# Patient Record
Sex: Female | Born: 1960 | Race: Black or African American | Hispanic: No | Marital: Single
Health system: Southern US, Community
[De-identification: ages and names within clinical notes are randomized; demographics above are authoritative.]

---

## 2021-01-08 ENCOUNTER — Emergency Department (HOSPITAL_COMMUNITY)
Admission: EM | Admit: 2021-01-08 | Discharge: 2021-01-09 | Disposition: A | Discharge status: Home / Self Care | Payer: Medicaid Other | Attending: Emergency Medicine | Admitting: Emergency Medicine

## 2021-01-08 ENCOUNTER — Emergency Department (HOSPITAL_COMMUNITY): Payer: Medicaid Other

## 2021-01-08 DIAGNOSIS — Z20822 Contact with and (suspected) exposure to covid-19: Secondary | ICD-10-CM | POA: Diagnosis not present

## 2021-01-08 DIAGNOSIS — R8271 Bacteriuria: Secondary | ICD-10-CM

## 2021-01-08 DIAGNOSIS — I10 Essential (primary) hypertension: Secondary | ICD-10-CM | POA: Insufficient documentation

## 2021-01-08 DIAGNOSIS — R519 Headache, unspecified: Secondary | ICD-10-CM

## 2021-01-08 DIAGNOSIS — R202 Paresthesia of skin: Secondary | ICD-10-CM | POA: Insufficient documentation

## 2021-01-08 DIAGNOSIS — R2990 Unspecified symptoms and signs involving the nervous system: Secondary | ICD-10-CM | POA: Diagnosis not present

## 2021-01-08 DIAGNOSIS — Z859 Personal history of malignant neoplasm, unspecified: Secondary | ICD-10-CM | POA: Diagnosis not present

## 2021-01-08 DIAGNOSIS — Z79899 Other long term (current) drug therapy: Secondary | ICD-10-CM | POA: Diagnosis not present

## 2021-01-08 DIAGNOSIS — R42 Dizziness and giddiness: Secondary | ICD-10-CM | POA: Diagnosis not present

## 2021-01-08 DIAGNOSIS — M545 Low back pain, unspecified: Secondary | ICD-10-CM

## 2021-01-08 DIAGNOSIS — M549 Dorsalgia, unspecified: Secondary | ICD-10-CM | POA: Insufficient documentation

## 2021-01-08 DIAGNOSIS — R791 Abnormal coagulation profile: Secondary | ICD-10-CM | POA: Diagnosis not present

## 2021-01-08 LAB — I-STAT BETA HCG BLOOD, ED (MC, WL, AP ONLY): I-stat hCG, quantitative: <5 m[IU]/mL (ref <5)

## 2021-01-08 LAB — COMPREHENSIVE METABOLIC PANEL
ALT: 17 U/L (ref 0–44)
AST: 19 U/L (ref 15–41)
Albumin: 3.9 g/dL (ref 3.5–5.0)
Alkaline Phosphatase: 100 U/L (ref 38–126)
Anion gap: 10 (ref 5–15)
BUN: 14 mg/dL (ref 6–20)
CO2: 26 mmol/L (ref 22–32)
Calcium: 9.1 mg/dL (ref 8.9–10.3)
Chloride: 100 mmol/L (ref 98–111)
Creatinine, Ser: 0.95 mg/dL (ref 0.44–1.00)
GFR, Estimated: >60 mL/min (ref >60)
Glucose, Bld: 112 mg/dL — ABNORMAL HIGH (ref 70–99)
Potassium: 3.8 mmol/L (ref 3.5–5.1)
Sodium: 136 mmol/L (ref 135–145)
Total Bilirubin: 0.6 mg/dL (ref 0.3–1.2)
Total Protein: 7.4 g/dL (ref 6.5–8.1)

## 2021-01-08 LAB — I-STAT CHEM 8, ED
BUN: 16 mg/dL (ref 6–20)
Calcium, Ion: 1.06 mmol/L — ABNORMAL LOW (ref 1.15–1.40)
Chloride: 103 mmol/L (ref 98–111)
Creatinine, Ser: 0.9 mg/dL (ref 0.44–1.00)
Glucose, Bld: 109 mg/dL — ABNORMAL HIGH (ref 70–99)
HCT: 51 % — ABNORMAL HIGH (ref 36.0–46.0)
Hemoglobin: 17.3 g/dL — ABNORMAL HIGH (ref 12.0–15.0)
Potassium: 3.8 mmol/L (ref 3.5–5.1)
Sodium: 138 mmol/L (ref 135–145)
TCO2: 25 mmol/L (ref 22–32)

## 2021-01-08 LAB — DIFFERENTIAL
Abs Immature Granulocytes: 0.05 10*3/uL (ref 0.00–0.07)
Basophils Absolute: 0 10*3/uL (ref 0.0–0.1)
Basophils Relative: 0 %
Eosinophils Absolute: 0.1 10*3/uL (ref 0.0–0.5)
Eosinophils Relative: 1 %
Immature Granulocytes: 0 %
Lymphocytes Relative: 32 %
Lymphs Abs: 3.9 10*3/uL (ref 0.7–4.0)
Monocytes Absolute: 0.7 10*3/uL (ref 0.1–1.0)
Monocytes Relative: 6 %
Neutro Abs: 7.4 10*3/uL (ref 1.7–7.7)
Neutrophils Relative %: 61 %

## 2021-01-08 LAB — RESP PANEL BY RT-PCR (FLU A&B, COVID) ARPGX2
Influenza A by PCR: NEGATIVE
Influenza B by PCR: NEGATIVE
SARS Coronavirus 2 by RT PCR: NEGATIVE

## 2021-01-08 LAB — CBC
HCT: 49 % — ABNORMAL HIGH (ref 36.0–46.0)
Hemoglobin: 15.7 g/dL — ABNORMAL HIGH (ref 12.0–15.0)
MCH: 31.5 pg (ref 26.0–34.0)
MCHC: 32 g/dL (ref 30.0–36.0)
MCV: 98.2 fL (ref 80.0–100.0)
Platelets: 235 10*3/uL (ref 150–400)
RBC: 4.99 MIL/uL (ref 3.87–5.11)
RDW: 13 % (ref 11.5–15.5)
WBC: 12.2 10*3/uL — ABNORMAL HIGH (ref 4.0–10.5)
nRBC: 0 % (ref 0.0–0.2)

## 2021-01-08 LAB — PROTIME-INR
INR: 1 (ref 0.8–1.2)
Prothrombin Time: 12.8 seconds (ref 11.4–15.2)

## 2021-01-08 LAB — APTT: aPTT: 29 seconds (ref 24–36)

## 2021-01-08 MED ORDER — METOCLOPRAMIDE HCL 5 MG/ML IJ SOLN
10.0000 mg | Freq: Once | INTRAMUSCULAR | Status: AC
  Administered 2021-01-08: 10 mg via INTRAVENOUS
  Filled 2021-01-08: qty 2

## 2021-01-08 MED ORDER — DEXAMETHASONE SODIUM PHOSPHATE 10 MG/ML IJ SOLN
10.0000 mg | Freq: Once | INTRAMUSCULAR | Status: AC
  Administered 2021-01-08: 10 mg via INTRAVENOUS
  Filled 2021-01-08: qty 1

## 2021-01-08 MED ORDER — SODIUM CHLORIDE 0.9% FLUSH
3.0000 mL | Freq: Once | INTRAVENOUS | Status: AC
  Administered 2021-01-08: 3 mL via INTRAVENOUS

## 2021-01-08 MED ORDER — LORAZEPAM 2 MG/ML IJ SOLN
1.0000 mg | Freq: Once | INTRAMUSCULAR | Status: AC
  Administered 2021-01-08: 1 mg via INTRAVENOUS
  Filled 2021-01-08: qty 1

## 2021-01-08 MED ORDER — DIPHENHYDRAMINE HCL 25 MG PO CAPS
50.0000 mg | ORAL_CAPSULE | Freq: Once | ORAL | Status: AC
  Administered 2021-01-08: 50 mg via ORAL
  Filled 2021-01-08: qty 2

## 2021-01-08 MED ORDER — KETOROLAC TROMETHAMINE 15 MG/ML IJ SOLN
15.0000 mg | Freq: Once | INTRAMUSCULAR | Status: AC
  Administered 2021-01-08: 15 mg via INTRAVENOUS
  Filled 2021-01-08: qty 1

## 2021-01-08 NOTE — Code Documentation (Signed)
Stroke Response Nurse Documentation Code Documentation  Kathryn Frank is a 60 y.o. female arriving to [facility] ED via [facility] on 7/29 with past medical hx of cancer, depression. Code stroke was activated by EMS. Patient from home where she was LKW at 1630 and now complaining of right sided weakness. On No antithrombotic. Stroke team at the bedside on patient arrival. Labs drawn and patient cleared for CT by Dr. Lisbeth Renshaw. Patient to CT with team. NIHSS 5, see documentation for details and code stroke times. Patient with right arm weakness, right leg weakness, and right decreased sensation on exam. The following imaging was completed:  CT/MRI. Patient is not a candidate for tPA due to inconsistent findings on exam.  Bedside handoff with ED RN Cala Bradford.    Rose Fillers  Rapid Response RN

## 2021-01-08 NOTE — ED Provider Notes (Signed)
[facility] EMERGENCY DEPARTMENT Provider Note   CSN: [id number] Arrival date & time: 01/08/21  1950     History Chief Complaint  Patient presents with   Code Stroke    Kathryn Frank is a 60 y.o. female presenting for evaluation of headache, tingling, lightheadedness.   Patient states about 30 minutes prior to arrival she had sudden onset headache, dizziness, lightheadedness, and feeling like her body was weak/sleepy on the right side.  She felt she was going to pass out and called EMS.  She reports tingling of the hands and feet, more so on the right side of the body.  She also reports bilateral back pain is concerned about her kidneys.  She has pressure when she urinates and frequency, although no abnormal smell or dysuria.  No hematuria.  She denies fevers or chills.  No chest pain, shortness breath, cough, nausea vomiting abdominal pain.  She is not from the area, visiting family.  She forgot her medicines, and has not had any of them for 3 days.  She does not know what medicine she is on, but states she takes medicine for blood pressure, depression, cancer, melatonin.  No history of diabetes.    HPI     No past medical history on file.  There are no problems to display for this patient.    OB History   No obstetric history on file.     No family history on file.     Home Medications Prior to Admission medications   Not on File    Allergies    Patient has no allergy information on record.  Review of Systems   Review of Systems  Genitourinary:  Positive for frequency.  Musculoskeletal:  Positive for back pain.  Neurological:  Positive for light-headedness and headaches.  All other systems reviewed and are negative.  Physical Exam Updated Vital Signs BP (!) 171/89   Pulse 65   Temp 98.3 F (36.8 C) (Oral)   Resp 17   Wt 78.8 kg   SpO2 97%   Physical Exam Vitals and nursing note reviewed.  Constitutional:      General: She is not in  acute distress.    Appearance: Normal appearance.     Comments: Nontoxic  HENT:     Head: Normocephalic and atraumatic.  Eyes:     Extraocular Movements: Extraocular movements intact.     Conjunctiva/sclera: Conjunctivae normal.     Pupils: Pupils are equal, round, and reactive to light.  Cardiovascular:     Rate and Rhythm: Normal rate and regular rhythm.     Pulses: Normal pulses.  Pulmonary:     Effort: Pulmonary effort is normal. No respiratory distress.     Breath sounds: Normal breath sounds. No wheezing.     Comments: Speaking in full sentences.  Clear lung sounds in all fields. Abdominal:     General: There is no distension.     Palpations: Abdomen is soft. There is no mass.     Tenderness: There is no abdominal tenderness. There is no guarding or rebound.  Musculoskeletal:        General: Normal range of motion.     Cervical back: Normal range of motion and neck supple.     Comments: Diffuse tenderness palpation of the back bilaterally.  Skin:    General: Skin is warm and dry.     Capillary Refill: Capillary refill takes less than 2 seconds.  Neurological:     Mental Status:  She is alert and oriented to person, place, and time.     GCS: GCS eye subscore is 4. GCS verbal subscore is 5. GCS motor subscore is 6.     Cranial Nerves: Cranial nerves are intact.     Sensory: Sensory deficit present.     Motor: Motor function is intact.     Comments: Patient reports decreased sensation on the right side, however no weakness or complete sensory deficit.  Psychiatric:        Mood and Affect: Mood and affect normal.        Speech: Speech normal.        Behavior: Behavior normal.    ED Results / Procedures / Treatments   Labs (all labs ordered are listed, but only abnormal results are displayed) Labs Reviewed  CBC - Abnormal; Notable for the following components:      Result Value   WBC 12.2 (*)    Hemoglobin 15.7 (*)    HCT 49.0 (*)    All other components within normal  limits  COMPREHENSIVE METABOLIC PANEL - Abnormal; Notable for the following components:   Glucose, Bld 112 (*)    All other components within normal limits  I-STAT CHEM 8, ED - Abnormal; Notable for the following components:   Glucose, Bld 109 (*)    Calcium, Ion 1.06 (*)    Hemoglobin 17.3 (*)    HCT 51.0 (*)    All other components within normal limits  RESP PANEL BY RT-PCR (FLU A&B, COVID) ARPGX2  PROTIME-INR  APTT  DIFFERENTIAL  URINALYSIS, ROUTINE W REFLEX MICROSCOPIC  CBG MONITORING, ED  I-STAT BETA HCG BLOOD, ED ([facility], [facility], [facility] ONLY)    EKG EKG Interpretation  Date/Time:  Friday January 08 2021 20:55:36 EDT Ventricular Rate:  87 PR Interval:  156 QRS Duration: 102 QT Interval:  411 QTC Calculation: 495 R Axis:   52 Text Interpretation: Sinus rhythm Biatrial enlargement LVH with secondary repolarization abnormality Borderline prolonged QT interval No significant change since last tracing Confirmed by Richardean Canal [phone]) on 01/08/2021 8:59:37 PM  Radiology CT Lumbar Spine Wo Contrast  Result Date: 01/08/2021 CLINICAL DATA:  Initial evaluation for acute low back pain. EXAM: CT LUMBAR SPINE WITHOUT CONTRAST TECHNIQUE: Multidetector CT imaging of the lumbar spine was performed without intravenous contrast administration. Multiplanar CT image reconstructions were also generated. COMPARISON:  None available. FINDINGS: Segmentation: Standard. Lowest well-formed disc space labeled the L5-S1 level. Alignment: Straightening of the normal lumbar lordosis. No listhesis. Vertebrae: Vertebral body height maintained without acute or chronic fracture. Visualized sacrum and pelvis intact. SI joints symmetric and within normal limits. No discrete lytic or blastic osseous lesions. Paraspinal and other soft tissues: Paraspinous soft tissues within normal limits. Mild aortic atherosclerosis. Disc levels: L1-2:  Negative interspace.  Mild facet hypertrophy.  No stenosis. L2-3: Minimal disc bulge. Mild to  moderate right worse than left facet hypertrophy. No stenosis. L3-4: Mild disc bulge. Superimposed right foraminal to extraforaminal disc protrusion closely approximates the exiting right L3 nerve root (series 4, image 85). Moderate right with mild left facet hypertrophy. No significant spinal stenosis. Mild bilateral L3 foraminal narrowing. L4-5: Degenerative intervertebral disc space narrowing with diffuse disc bulge and disc desiccation. Reactive endplate spurring. Moderate facet hypertrophy. Resultant mild narrowing of the lateral recesses bilaterally. Central canal remains patent. Moderate bilateral L4 foraminal stenosis. L5-S1: Broad-based right subarticular disc protrusion contacts the descending right S1 nerve root in the right lateral recess (series 4, image 113). Moderate facet  hypertrophy. No significant spinal stenosis. Moderate bilateral L5 foraminal narrowing. IMPRESSION: 1. No acute abnormality within the lumbar spine. 2. Right foraminal to extraforaminal disc protrusion at L3-4, closely approximating and potentially affecting the exiting right L3 nerve root. 3. Broad-based right subarticular disc protrusion at L5-S1, contacting and mildly displacing the descending right S1 nerve root. 4. Moderate lower lumbar facet hypertrophy with resultant moderate bilateral L4 and L5 foraminal stenosis. Aortic Atherosclerosis (ICD10-I70.0). Electronically Signed   By: Rise MuBenjamin  McClintock M.D.   On: 01/08/2021 22:39   MR BRAIN WO CONTRAST  Result Date: 01/08/2021 CLINICAL DATA:  Stroke, follow-up. EXAM: MRI HEAD WITHOUT CONTRAST TECHNIQUE: Multiplanar, multiecho pulse sequences of the brain and surrounding structures were obtained without intravenous contrast. COMPARISON:  Noncontrast head CT performed earlier today 01/08/2021. FINDINGS: Brain: The patient was unable to tolerate the full examination. As a result, only axial and coronal diffusion-weighted imaging, a sagittal T1 weighted sequence and an axial  T2/FLAIR sequence could be obtained. The acquired axial T2/FLAIR sequence is moderately motion degraded. Cerebral volume is normal. There is a 3 mm focus of apparent restricted diffusion within the right frontal lobe centrum semiovale (series 2, image 31) (series 250, image 31). However, this is appreciated on the axial diffusion-weighted sequence and corresponding ADC map only (not appreciated on the coronal diffusion-weighted imaging). This could reflect an acute infarct, an active focus of demyelination or artifact. No evidence of acute infarct elsewhere. Multifocal T2/FLAIR hyperintensity within the cerebral white matter, overall moderate, but significantly advanced for age. On the acquired sequences, there is no evidence of an intracranial mass or extra-axial fluid collection. No midline shift. Vascular: Poorly assessed on the acquired sequences. Skull and upper cervical spine: No focal suspicious marrow lesion identified on the acquired sequences. Sinuses/Orbits: Visualized orbits show no acute finding. Trace bilateral ethmoid sinus mucosal thickening. IMPRESSION: Prematurely terminated, motion degraded and limited examination as described. 3 mm focus of apparent restricted diffusion within the right frontal lobe centrum semiovale, as described. This could reflect an acute infarct, a site of active demyelination or artifact. Multifocal T2/FLAIR hyperintense signal abnormality within the cerebral white matter, overall moderate, but significantly advanced for age. Findings are nonspecific, but given the appearance and patient demographics primary differential considerations are age-advanced chronic small vessel ischemic disease or a demyelinating process (such as multiple sclerosis). Clinical correlation is recommended. Electronically Signed   By: Jackey LogeKyle  Golden DO   On: 01/08/2021 21:00   CT HEAD CODE STROKE WO CONTRAST  Result Date: 01/08/2021 CLINICAL DATA:  Code stroke. Neuro deficit, acute, stroke  suspected. Additional history provided: Headache with left-sided facial droop and right-sided weakness. EXAM: CT HEAD WITHOUT CONTRAST TECHNIQUE: Contiguous axial images were obtained from the base of the skull through the vertex without intravenous contrast. COMPARISON:  No pertinent prior exams available for comparison. FINDINGS: Brain: Cerebral volume is normal. 8 mm age-indeterminate lacunar infarct within the left corona radiata/internal capsule (series 2, image 17). Mild patchy and ill-defined hypoattenuation elsewhere within the cerebral white matter, nonspecific but compatible with chronic small vessel ischemic disease. There is no acute intracranial hemorrhage. No demarcated cortical infarct. No extra-axial fluid collection. No evidence of an intracranial mass. No midline shift. Vascular: No hyperdense vessel. Skull: Normal. Negative for fracture or focal lesion. Sinuses/Orbits: Visualized orbits show no acute finding. Trace bilateral ethmoid sinus mucosal thickening. ASPECTS [facility]([place] Stroke Program Early CT Score) - Ganglionic level infarction (caudate, lentiform nuclei, internal capsule, insula, M1-M3 cortex): 6 - Supraganglionic infarction (M4-M6 cortex): 3 Total score (  0-10 with 10 being normal): 9 These results were communicated to Dr. Rosalita Levan At 8:09 pmon 7/29/2022by text page via the [facility] messaging system. IMPRESSION: No acute intracranial hemorrhage or acute demarcated cortical infarction. 8 mm age-indeterminate lacunar infarct within the left corona radiata/internal capsule. ASPECTS is 9. Consider brain MRI for further evaluation. Background minimal chronic small-vessel ischemic changes within the cerebral white matter. Electronically Signed   By: Jackey Loge DO   On: 01/08/2021 20:10    Procedures Procedures   Medications Ordered in ED Medications  sodium chloride flush (NS) 0.9 % injection 3 mL (3 mLs Intravenous Given 01/08/21 2051)  LORazepam (ATIVAN) injection 1 mg (1 mg Intravenous  Given 01/08/21 2020)  ketorolac (TORADOL) 15 MG/ML injection 15 mg (15 mg Intravenous Given 01/08/21 2227)  metoCLOPramide (REGLAN) injection 10 mg (10 mg Intravenous Given 01/08/21 2227)  diphenhydrAMINE (BENADRYL) capsule 50 mg (50 mg Oral Given 01/08/21 2228)  dexamethasone (DECADRON) injection 10 mg (10 mg Intravenous Given 01/08/21 2227)    ED Course  I have reviewed the triage vital signs and the nursing notes.  Pertinent labs & imaging results that were available during my care of the patient were reviewed by me and considered in my medical decision making (see chart for details).    MDM Rules/Calculators/A&P                           Patient presenting for evaluation of headache, tingling, lightheadedness, back pain.  On exam, patient appears nontoxic.  No focal neurologic deficits.  Initially patient was a code stroke, evaluated by neurology.  Had a CT head which showed possible subacute infarct.  MRI was ordered.  MRI negative for acute stroke.  I discussed these results with Dr. Jerrell Belfast from neurology who reviewed the imaging personally.  While there was some concern for possible hyperintense signal, per neurology this is not consistent with patient symptoms and is most likely artifact.  Patient can follow-up outpatient with neurology.  Concern for complex migraine, will treat symptomatically.  After headache cocktail, patient reports improvement of her headache and improvement of her back pain, although not complete resolution.  She continues to have some tingling.  She remains hypertensive, this is likely in the setting of not taking her medication, however she does not know which she is taking, will hold off on treatment in the ER, and instead have her restart her medications when she returns home.  Labs overall reassuring.  No electrolyte abnormalities.  In the setting of urinary symptoms and back pain, will obtain a urine to ensure no infection.  CT of the lumbar spine negative for  bony lesion or acute abnormality requiring neurosurgery consultation.  On my evaluation, there was no obvious kidney stone or hydronephrosis.   Pt signed out to Peterson Lombard, MD for f/u on urine. Likely plan for d/c  Final Clinical Impression(s) / ED Diagnoses Final diagnoses:  None    Rx / DC Orders ED Discharge Orders     None        Alveria Apley, PA-C 01/09/21 0006    Charlynne Pander, MD 01/10/21 [phone]

## 2021-01-08 NOTE — Consult Note (Signed)
Neurology Consultation  Reason for Consult: Code stroke-right-sided weakness and numbness headache Referring Physician: Dr. Chaney Malling  CC: Right-sided weakness numbness and a headache.  History is obtained from: Patient  HPI: Kathryn Frank is a 60 y.o. 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 Last known well around 4:30 PM today. She says she was in her usual state of health, got her chemotherapy yesterday at [facility] and sometime around 4:30 PM started feeling tingling and numbness around her right face arm and leg that started after a headache. No prior history of strokes.  No history of complex migraines in the past. Reports extreme anxiety at this time, was very tearful throughout the encounter. Evaluated at the ER bridge-some effort dependent right-sided weakness noted along with hemisensory loss on the right side with sharp cut off in the middle. Noncontrast head CT concerning for a age-indeterminate hypodensity in the left corona radiata with an aspects of 9. Based on the inconsistent clinical exam, decided to go for a stat MRI-stroke limited sequence with DWI/GRE/flair-the MRI revealed a punctate area of restricted diffusion in the right centrum semiovale which does not correlate with her symptoms whatsoever.  Also complains of back pain.  No shortness of breath.  No fevers chills.  LKW: 4:30 PM today tpa given?: no, inconsistent exam-not likely an acute stroke Premorbid modified Rankin scale (mRS): 0  ROS: Full ROS was performed and is negative except as noted in the HPI. No past medical history on file.      No family history on file.   Social History:   has no history on file for tobacco  use, alcohol use, and drug use.  Medications  Current Facility-Administered Medications:    sodium chloride flush (NS) 0.9 % injection 3 mL, 3 mL, Intravenous, Once, Silverio Lay Gonzella Lex, MD No current outpatient medications on file.   Exam: Current vital signs: Wt 78.8 kg  Vital signs in last 24 hours: Weight:  [78.8 kg] 78.8 kg (07/29 1957) General: Awake alert very anxious HNT: Normocephalic atraumatic Lungs: Clear Abdomen: Big scar from a prior surgery CVS: Regular rate rhythm Extremities warm well perfused Neurological exam Anxious appearing awake alert oriented x3 No dysarthria No aphasia Follows all commands Cranial nerves II to XII intact Motor examination with right upper extremity drift 4/5 strength, right lower extremity drift 3-4 minus/5 strength-very effort dependent.  Left sided full strength. Sensory exam with hemisensory loss on the right side with a sharp cut off in the midline with splitting of vibration on the forehead. Coordination with no obvious dysmetria NIH stroke scale below 1a Level of Conscious.: 0 1b LOC Questions: 0 1c LOC Commands: 0 2 Best Gaze: 0 3 Visual: 0 4 Facial Palsy: 0 5a Motor Arm - left: 0 5b Motor Arm - Right: 1 6a Motor Leg - Left: 0 6b Motor Leg - Right: 2 7 Limb Ataxia: 0 8 Sensory: 2 9 Best Language: 0 10 Dysarthria: 0 11 Extinct. and Inatten.: 0 TOTAL: 5    Labs I have reviewed labs in epic and the results pertinent to this consultation are:  CBC    Component Value Date/Time   WBC 12.2 (H) 01/08/2021 1952   RBC 4.99 01/08/2021 1952   HGB 17.3 (H) 01/08/2021  2002   HCT 51.0 (H) 01/08/2021 2002   PLT 235 01/08/2021 1952   MCV 98.2 01/08/2021 1952   MCH 31.5 01/08/2021 1952   MCHC 32.0 01/08/2021 1952   RDW 13.0 01/08/2021 1952   LYMPHSABS 3.9 01/08/2021 1952   MONOABS 0.7 01/08/2021 1952   EOSABS 0.1 01/08/2021 1952   BASOSABS 0.0 01/08/2021 1952    CMP     Component Value Date/Time   NA 138 01/08/2021  2002   K 3.8 01/08/2021 2002   CL 103 01/08/2021 2002   GLUCOSE 109 (H) 01/08/2021 2002   BUN 16 01/08/2021 2002   CREATININE 0.90 01/08/2021 2002    Imaging I have reviewed the images obtained:  CT-head-no acute changes.  Question a hypodensity in the left corona radiata-internal capsule-age-indeterminate. MRI of the brain-limited-stroke protocol-with a punctate area of questionable restricted diffusion versus artifact in the right corona radiata with microvascular ischemic disease.  Assessment:  60 year old with above past medical history presenting for evaluation of sudden onset of a headache after which she started getting some right-sided tingling and numbness. CT head was concerning for a left-sided lacunar infarction but that presumably is chronic based on the MRI.  The MRI did reveal a punctate area of what appears to be artifact versus restricted diffusion in the right corona radiata which does not seem to correlate with the patient symptoms at this time and is likely an incidental versus artifactual finding. I would treat this as a complex migraine given the headache preceding the symptoms and she is not a candidate for IV tPA due to an inconsistent exam and no stroke on the MRI. Her back pain, is also new-needs further evaluation by lumbar spine imaging  Recommendations: At this time, I would give her a migraine cocktail-Toradol Benadryl Reglan IV or a cocktail of the ED provider choice. Reassess after the migraine cocktail MRI or CT of the L-spine to rule out acute abnormalities given the sudden onset of pain and some right-sided weakness although the exam is very effort dependent. No further work-up from a stroke perspective at this time as the imaging finding is likely artifactual and does not correlate to her current presentation at all.  Plan discussed with Dr. Silverio Lay in the emergency room    Addendum Patient much better after migraine cocktail. Discussed the possible  stroke finding on MRI to be artifactual most likely and not correlated to her current presentation with the APP in the ED. Okay from my end to be discharged if headache is better. Can follow-up with outpatient neurology in 4 to 6 weeks  -- Milon Dikes, MD Neurologist [facility] Pager: [phone]

## 2021-01-09 LAB — URINALYSIS, ROUTINE W REFLEX MICROSCOPIC
Bilirubin Urine: NEGATIVE
Glucose, UA: NEGATIVE mg/dL
Ketones, ur: NEGATIVE mg/dL
Leukocytes,Ua: NEGATIVE
Nitrite: NEGATIVE
Protein, ur: NEGATIVE mg/dL
Specific Gravity, Urine: 1.016 (ref 1.005–1.030)
pH: 6 (ref 5.0–8.0)

## 2021-01-09 MED ORDER — CEPHALEXIN 500 MG PO CAPS: 500.0000 mg | ORAL_CAPSULE | Freq: Two times a day (BID) | ORAL | 0 refills | Status: AC

## 2021-01-09 MED ORDER — METHOCARBAMOL 500 MG PO TABS: 500.0000 mg | ORAL_TABLET | Freq: Two times a day (BID) | ORAL | 0 refills | Status: AC | PRN

## 2021-01-09 MED ORDER — CEPHALEXIN 250 MG PO CAPS
500.0000 mg | ORAL_CAPSULE | Freq: Once | ORAL | Status: AC
  Administered 2021-01-09: 500 mg via ORAL
  Filled 2021-01-09: qty 2

## 2021-01-09 MED ORDER — LIDOCAINE 5 % EX PTCH: 1.0000 | MEDICATED_PATCH | CUTANEOUS | 0 refills | Status: AC

## 2021-01-09 MED ORDER — METHOCARBAMOL 500 MG PO TABS
500.0000 mg | ORAL_TABLET | Freq: Once | ORAL | Status: AC
  Administered 2021-01-09: 500 mg via ORAL
  Filled 2021-01-09: qty 1

## 2021-01-09 NOTE — Discharge Instructions (Addendum)
Take Tylenol as needed for pain. Use Lidoderm patches to help with pain control. Use Robaxin as needed for muscle stiffness or soreness. Follow-up closely with your primary care doctor for recheck of your symptoms. Resume your medication as soon as you return home to help manage her blood pressure and your symptoms. Follow-up with an outpatient neurologist (which can be referred to you by your primary care doctor) if you continue to have tingling of your hands or feet. Return to the emergency room with any new, worsening, concerning symptoms.

## 2021-01-09 NOTE — ED Notes (Signed)
Pt transported to MRI by this RN accompanied by neurologist and Rapid Response RN.

## 2021-01-09 NOTE — ED Provider Notes (Signed)
Patient care assumed pending urinalysis. She presents to the ED for headache, tingling in her lower extremities. UA with bacteria present. She has no dysuria but she does have back pain. Her headache has resolved on assessment. Will treat with antibiotics for possible UTI with outpatient follow-up and return precautions.   Tilden Fossa, MD 01/09/21 [phone]

## 2021-01-09 NOTE — ED Notes (Signed)
Pt brought to ED by [facility] via stretcher with c/o of stoke like symptoms after initial assessment by EMS. Per EMS, pt presents with left sided facial droop and weakness to right upper and lower extremities. Upon arrival to ED, pt met at bridge by neurologist and initial assessment performed, pt then transported to CT by this RN and EMS staff. Pt presents with no acute distress, CT tolerated well.  

## 2022-09-10 IMAGING — CT CT HEAD CODE STROKE
3 series · 14 of 47 positions shown, 16 images · non-contrast
Comparison: No pertinent prior exams available for comparison.

CLINICAL DATA: Code stroke. Neuro deficit, acute, stroke suspected.
Additional history provided: Headache with left-sided facial droop
and right-sided weakness.

EXAM:
CT HEAD WITHOUT CONTRAST
TECHNIQUE: Contiguous axial images were obtained from the base of the skull
through the vertex without intravenous contrast.

[Series 2: head 5.0 st · axial · 0.41mm/px · z∈[-178,-52]mm · 8 of 31 slices shown, 10 images]
[im 3/31  brain]
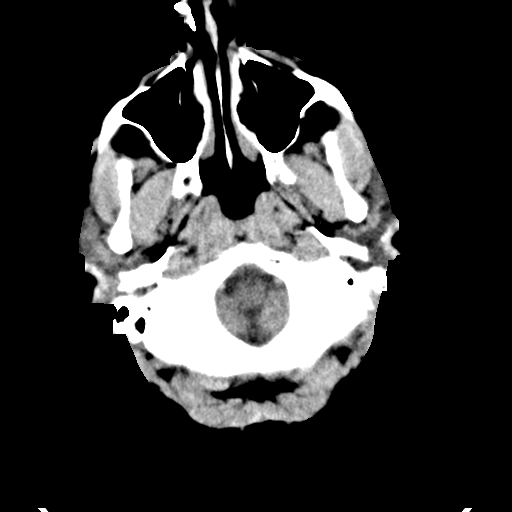
[im 3/31  bone]
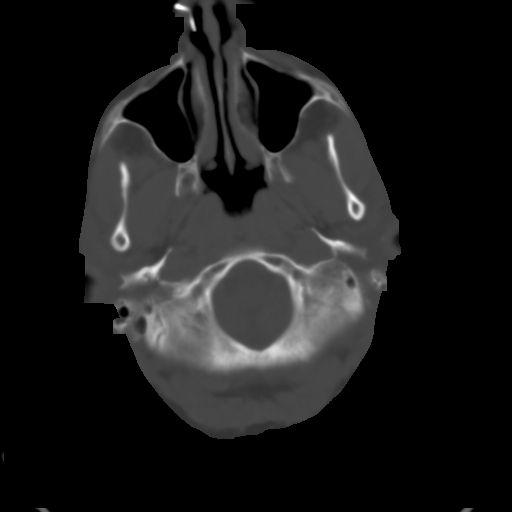
[im 7/31  brain]
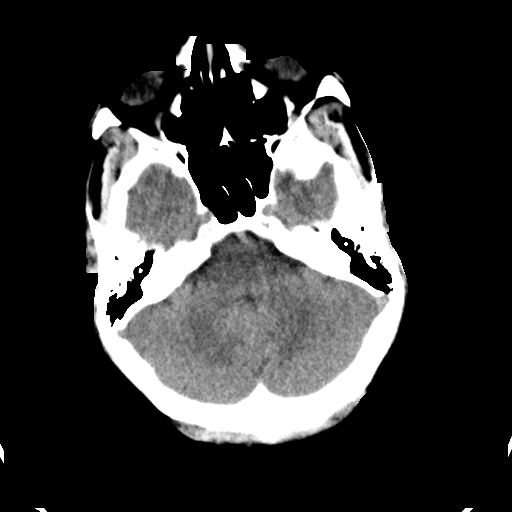
[im 10/31  brain]
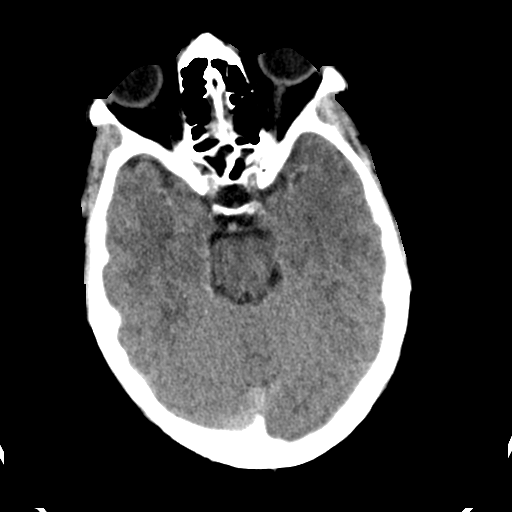
[im 14/31  brain]
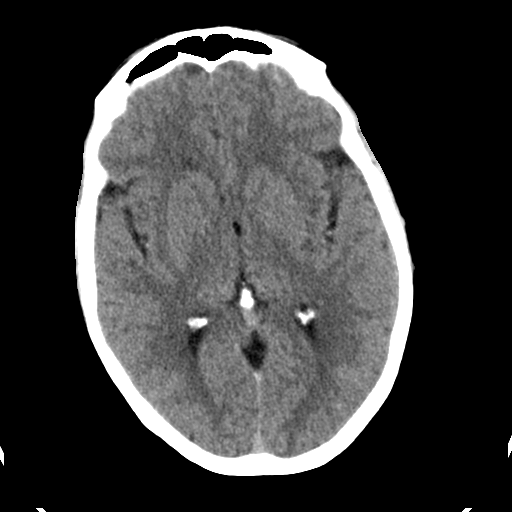
[im 17/31  brain]
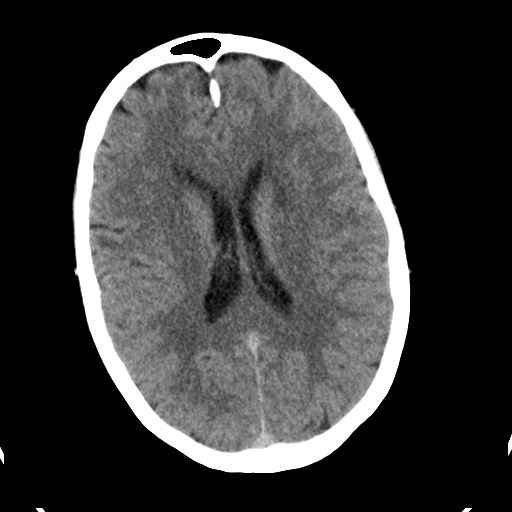
[im 17/31  bone]
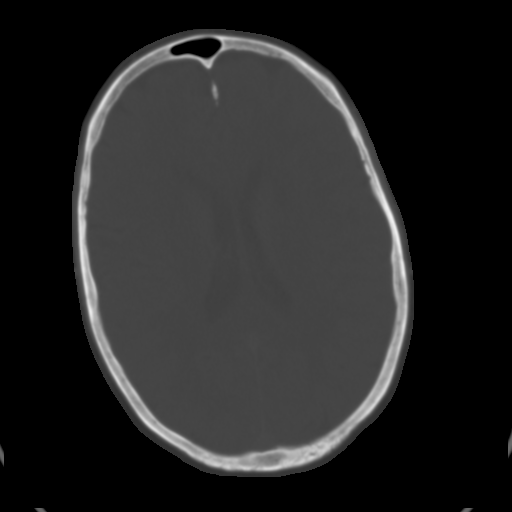
[im 21/31  brain]
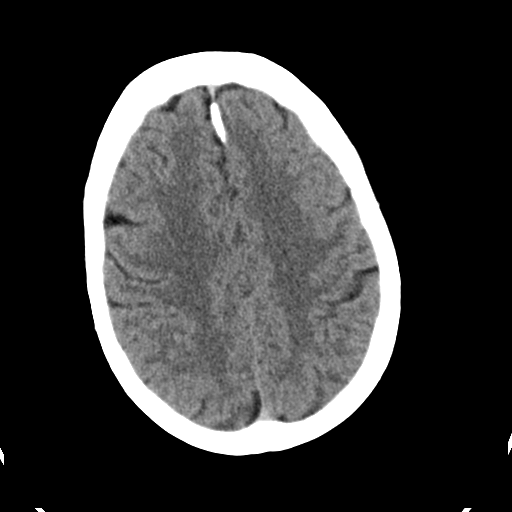
[im 24/31  brain]
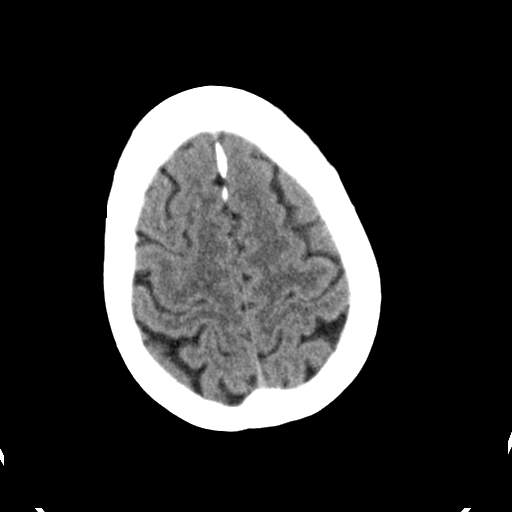
[im 28/31  brain]
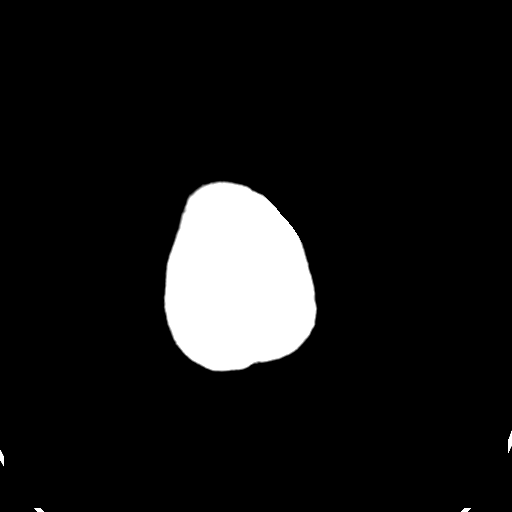

[Series 5: head 3.0 cor st · coronal · 0.27mm/px · 3 of 70 slices shown]
[im 24/70  brain]
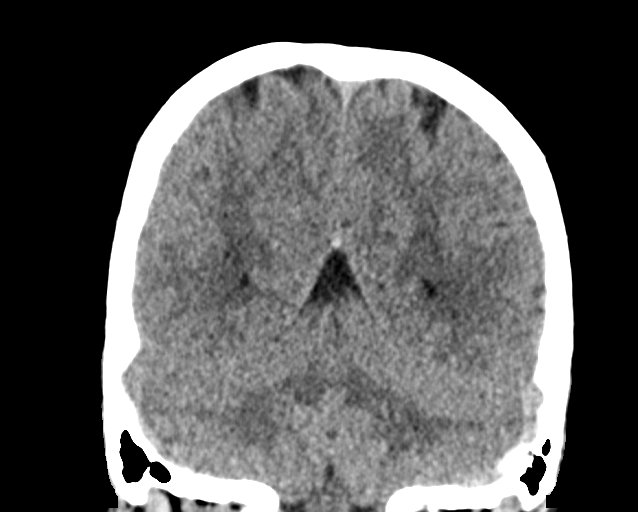
[im 31/70  brain]
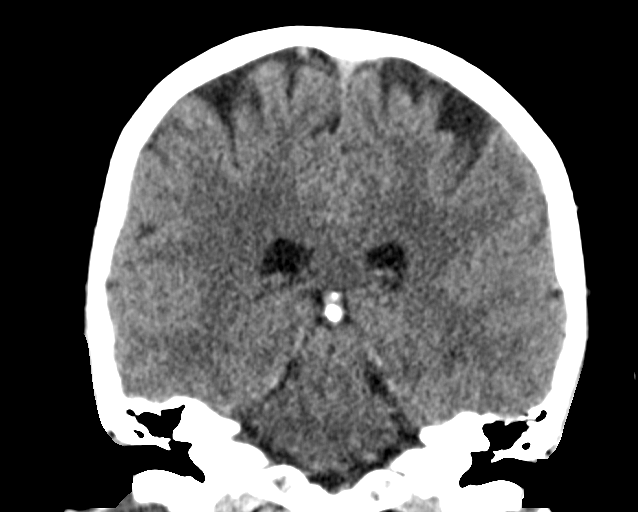
[im 39/70  brain]
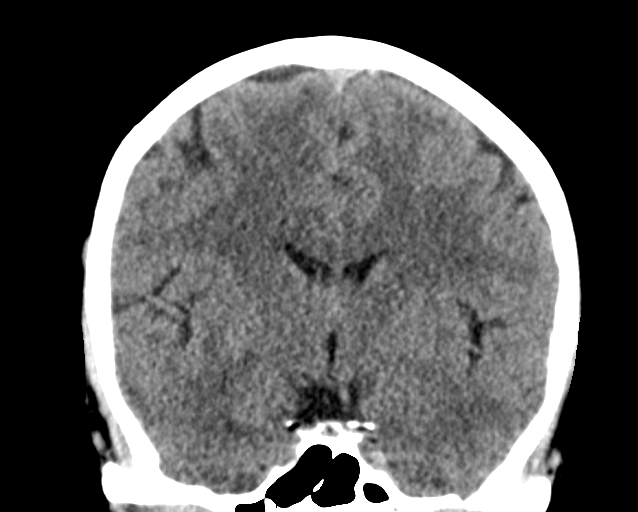

[Series 6: head 3.0 sag st · sagittal · 0.29mm/px · 3 of 61 slices shown]
[im 21/61  brain]
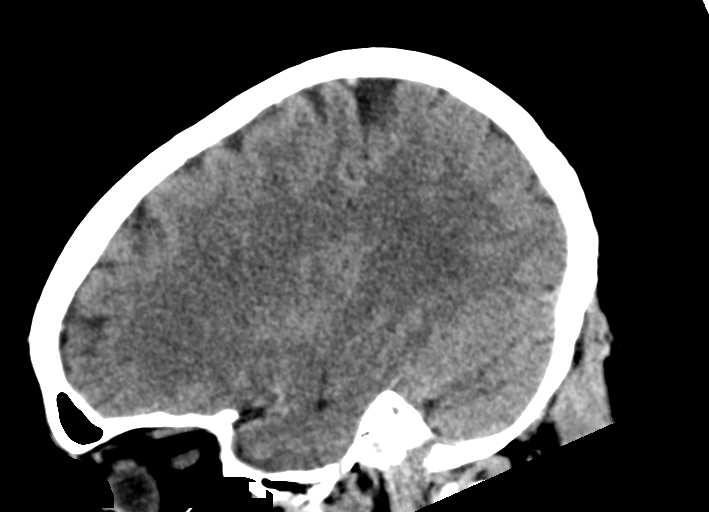
[im 31/61  brain]
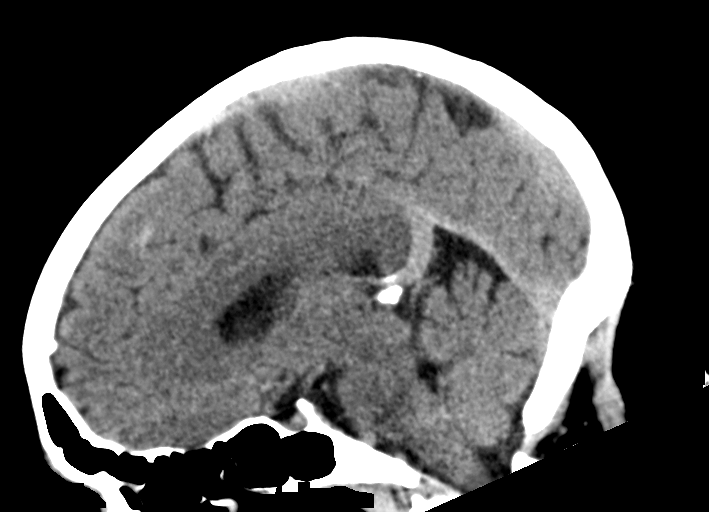
[im 41/61  brain]
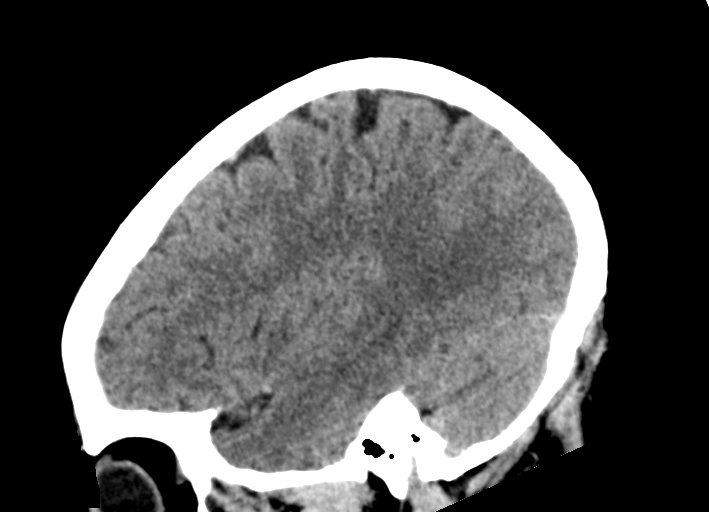

[14 of 47 positions shown; findings below may reference images not displayed]

FINDINGS: Brain:

Cerebral volume is normal.

8 mm age-indeterminate lacunar infarct within the left corona
radiata/internal capsule (series 2, image 17).

Mild patchy and ill-defined hypoattenuation elsewhere within the
cerebral white matter, nonspecific but compatible with chronic small
vessel ischemic disease.

There is no acute intracranial hemorrhage.

No demarcated cortical infarct.

No extra-axial fluid collection.

No evidence of an intracranial mass.

No midline shift.

Vascular: No hyperdense vessel.

Skull: Normal. Negative for fracture or focal lesion.

Sinuses/Orbits: Visualized orbits show no acute finding. Trace
bilateral ethmoid sinus mucosal thickening.

ASPECTS ([place] Stroke Program Early CT Score)

- Ganglionic level infarction (caudate, lentiform nuclei, internal
capsule, insula, M1-M3 cortex): 6

- Supraganglionic infarction (M4-M6 cortex): 3

Total score (0-10 with 10 being normal): 9

These results were communicated to Dr. Chocri At [DATE] pmon 01/08/2021by
text page via the [employer] messaging system.
IMPRESSION: No acute intracranial hemorrhage or acute demarcated cortical
infarction.

8 mm age-indeterminate lacunar infarct within the left corona
radiata/internal capsule. ASPECTS is 9. Consider brain MRI for
further evaluation.

Background minimal chronic small-vessel ischemic changes within the
cerebral white matter.
